# Patient Record
Sex: Female | Born: 2000 | Race: White | Hispanic: No | Marital: Single | Smoking: Never smoker
Health system: Southern US, Community
[De-identification: ages and names within clinical notes are randomized; demographics above are authoritative.]

## PROBLEM LIST (undated history)

## (undated) DIAGNOSIS — D580 Hereditary spherocytosis: Secondary | ICD-10-CM

## (undated) DIAGNOSIS — Z832 Family history of diseases of the blood and blood-forming organs and certain disorders involving the immune mechanism: Secondary | ICD-10-CM

## (undated) HISTORY — DX: Hereditary spherocytosis: D58.0

## (undated) HISTORY — DX: Family history of diseases of the blood and blood-forming organs and certain disorders involving the immune mechanism: Z83.2

---
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
## 2019-06-24 ENCOUNTER — Encounter: Payer: Self-pay | Admitting: Gastroenterology

## 2019-06-24 ENCOUNTER — Other Ambulatory Visit: Payer: Self-pay

## 2019-06-24 ENCOUNTER — Ambulatory Visit (INDEPENDENT_AMBULATORY_CARE_PROVIDER_SITE_OTHER): Payer: BC Managed Care – PPO | Admitting: Gastroenterology

## 2019-06-24 VITALS — BP 138/91 | HR 108 | Temp 98.3°F | Ht 62.5 in | Wt 107.2 lb

## 2019-06-24 DIAGNOSIS — R140 Abdominal distension (gaseous): Secondary | ICD-10-CM

## 2019-06-24 NOTE — Progress Notes (Signed)
Summer Rogers [address]  [address]  [place], [place] [address]  Main: [phone]  Fax: [phone]   Gastroenterology Consultation  Referring Provider:     Herb Grays, MD Primary Care Physician:  Herb Grays, MD Reason for Consultation:     Abdominal bloating        HPI:    Chief Complaint  Patient presents with  . New Patient (Initial Visit)  . Abdominal distention    Patient stated that for the past 3 weeks she started to have abdominal distention. Patient denied nausea, vomiting, diarrhea or constipation.    Summer Rogers is a 19 y.o. y/o female referred for consultation & management  by Dr. Herb Grays, MD.  Patient reports 70-month history of abdominal distention and bloating, specifically after eating, denies any abdominal pain.  States the distention and bloating is more with larger meals.  Specifically states that is not what she eats, or any certain diet triggers, but how much she eats that causes the symptoms.  Denies any nausea or vomiting, altered bowel habits, blood in stool.  Reports formed bowel movements every day.  No diarrhea or constipation.  No family history of GI malignancy.  No prior EGD or colonoscopy.  Denies any life stressors or anxiety.  Denies any new medications.  Started taking probiotics.  States had work-up for celiac disease by PCP this was negative.  Also states she avoided lactose for 1week based on PCP instruction, and this did not help.  Past Medical History:  Diagnosis Date  . Hereditary spherocytosis (HCC)     History reviewed. No pertinent surgical history.  Prior to Admission medications   Not on File    History reviewed. No pertinent family history.   Social History   Tobacco Use  . Smoking status: Never Smoker  . Smokeless tobacco: Never Used  Substance Use Topics  . Alcohol use: Never  . Drug use: Never    Allergies as of 06/24/2019  . (No Known Allergies)    Review of Systems:    All systems  reviewed and negative except where noted in HPI.   Physical Exam:  BP (!) 138/91   Pulse (!) 108   Temp 98.3 F (36.8 C) (Oral)   Ht 5' 2.5" (1.588 m)   Wt 107 lb 3.2 oz (48.6 kg)   BMI 19.29 kg/m  No LMP recorded. Psych:  Alert and cooperative. Normal mood and affect. General:   Alert,  Well-developed, well-nourished, pleasant and cooperative in NAD Head:  Normocephalic and atraumatic. Eyes:  Sclera clear, no icterus.   Conjunctiva pink. Ears:  Normal auditory acuity. Nose:  No deformity, discharge, or lesions. Mouth:  No deformity or lesions,oropharynx pink & moist. Neck:  Supple; no masses or thyromegaly. Abdomen:  Normal bowel sounds.  No bruits.  Soft, non-tender and non-distended without masses, hepatosplenomegaly or hernias noted.  No guarding or rebound tenderness.    Msk:  Symmetrical without gross deformities. Good, equal movement & strength bilaterally. Pulses:  Normal pulses noted. Extremities:  No clubbing or edema.  No cyanosis. Neurologic:  Alert and oriented x3;  grossly normal neurologically. Skin:  Intact without significant lesions or rashes. No jaundice. Lymph Nodes:  No significant cervical adenopathy. Psych:  Alert and cooperative. Normal mood and affect.   Labs: CBC No results found for: WBC, RBC, HGB, HCT, PLT, MCV, MCH, MCHC, RDW, LYMPHSABS, MONOABS, EOSABS, BASOSABS CMP  No results found for: NA, K, CL, CO2, GLUCOSE, BUN, CREATININE, CALCIUM,  PROT, ALBUMIN, AST, ALT, ALKPHOS, BILITOT, GFRNONAA, GFRAA  Imaging Studies: No results found.  Assessment and Plan:   Summer Rogers is a 19 y.o. y/o female has been referred for abdominal bloating and distention, especially with larger meals, with no abdominal pain or obstructive symptoms  Given the absence of any alarm symptoms, we have reassured her about her symptoms not being due to any concerning or worrisome etiology  Since she specifically states symptoms occur with larger meals, and after eating,  Some abdominal distention is expected after a larger meal.  She is not having any obstructive symptoms, nausea vomiting, diarrhea, or malabsorption or obstructive symptoms at all  We will obtain H. pylori breath test  Also discussed avoiding high FODMAP foods and handout given for the same.  Low FODMAP diet discussed as well  Obtain recent records from PCP, as these are not available to [place] at this time  If symptoms do not improve, can consider further testing.  However, due to the absence of any alarm symptoms at this time, no further imaging or work-up needed currently  Dr Vonda Antigua  Speech recognition software was used to dictate the above note.

## 2019-06-26 LAB — H. PYLORI BREATH TEST: H pylori Breath Test: NEGATIVE

## 2019-06-27 ENCOUNTER — Telehealth: Payer: Self-pay

## 2019-06-27 NOTE — Telephone Encounter (Signed)
-----   Message from Pasty Spillers, MD sent at 06/27/2019 11:14 AM EDT ----- Kandis Cocking please let the patient know, her h pylori test was negative

## 2019-06-27 NOTE — Telephone Encounter (Signed)
Called patient to let her know that her H Pylori was negative but that if she continued to have any issues, to please call us back. Patient agreed and had no questions.

## 2019-07-10 DIAGNOSIS — R140 Abdominal distension (gaseous): Secondary | ICD-10-CM | POA: Insufficient documentation

## 2019-07-15 ENCOUNTER — Telehealth: Payer: Self-pay

## 2019-07-15 NOTE — Telephone Encounter (Signed)
Medical records were requested and I faxed a release of information.

## 2019-08-11 ENCOUNTER — Telehealth: Payer: Self-pay

## 2019-08-11 NOTE — Telephone Encounter (Signed)
Patient started the low fod diet about 1 month after her appointment on 06/24/2019. Patient states she  Is still bloated but having no other symptoms. Patient wants to know what else she can do

## 2019-08-15 NOTE — Telephone Encounter (Signed)
error 

## 2019-08-15 NOTE — Telephone Encounter (Signed)
Dr. Michele Mcalpine response by secure chat: we can proceed with EGD if she would like. If she is not having daily soft BMs she can try miralax as well. if BMs are good, and symptoms continue we can do EGD.  I called patient and left her a detailed message giving her Dr. Michele Mcalpine recommendations. I asked her to call us back if she had further questions.

## 2019-09-30 ENCOUNTER — Encounter: Payer: Self-pay | Admitting: Gastroenterology

## 2019-09-30 ENCOUNTER — Other Ambulatory Visit: Payer: Self-pay

## 2019-09-30 ENCOUNTER — Ambulatory Visit (INDEPENDENT_AMBULATORY_CARE_PROVIDER_SITE_OTHER): Payer: BC Managed Care – PPO | Admitting: Gastroenterology

## 2019-09-30 VITALS — BP 141/84 | HR 137 | Temp 99.2°F | Ht 62.5 in | Wt 107.6 lb

## 2019-09-30 DIAGNOSIS — R1900 Intra-abdominal and pelvic swelling, mass and lump, unspecified site: Secondary | ICD-10-CM | POA: Diagnosis not present

## 2019-09-30 DIAGNOSIS — R140 Abdominal distension (gaseous): Secondary | ICD-10-CM | POA: Diagnosis not present

## 2019-09-30 DIAGNOSIS — R17 Unspecified jaundice: Secondary | ICD-10-CM

## 2019-09-30 NOTE — Patient Instructions (Signed)
Please go downstairs and get your contrast today.  Please do not eat or drink 4 hours before your CT Scan.

## 2019-10-01 ENCOUNTER — Other Ambulatory Visit: Payer: Self-pay

## 2019-10-01 DIAGNOSIS — R1900 Intra-abdominal and pelvic swelling, mass and lump, unspecified site: Secondary | ICD-10-CM

## 2019-10-01 DIAGNOSIS — R140 Abdominal distension (gaseous): Secondary | ICD-10-CM

## 2019-10-01 LAB — BILIRUBIN, FRACTIONATED(TOT/DIR/INDIR)
Bilirubin Total: 2.3 mg/dL — ABNORMAL HIGH (ref 0.0–1.2)
Bilirubin, Direct: 0.25 mg/dL (ref 0.00–0.40)
Bilirubin, Indirect: 2.05 mg/dL (ref 0.10–0.80)

## 2019-10-01 NOTE — Addendum Note (Signed)
Addended by: Adela Ports on: 10/01/2019 05:12 PM   Modules accepted: Orders

## 2019-10-01 NOTE — Progress Notes (Signed)
    Melodie Bouillon, MD [address]  [address]  [place], [place] [address]  Main: [phone]  Fax: [phone]   Primary Care Physician: Herb Grays, MD   Chief Complaint  Patient presents with  . Bloated    HPI: MADA SADIK is a 19 y.o. female here for follow-up.  Her main complaint is "distention, and not bloating".  States lower part of the abdomen is always distended.  She is not feeling any bloating, pain, nausea or vomiting.  Good appetite.  No weight loss.  Reports 1 formed bowel movement daily.  No dysphagia  No current outpatient medications on file.   No current facility-administered medications for this visit.    Allergies as of 09/30/2019  . (No Known Allergies)    ROS:  General: Negative for anorexia, weight loss, fever, chills, fatigue, weakness. ENT: Negative for hoarseness, difficulty swallowing , nasal congestion. CV: Negative for chest pain, angina, palpitations, dyspnea on exertion, peripheral edema.  Respiratory: Negative for dyspnea at rest, dyspnea on exertion, cough, sputum, wheezing.  GI: See history of present illness. GU:  Negative for dysuria, hematuria, urinary incontinence, urinary frequency, nocturnal urination.  Endo: Negative for unusual weight change.    Physical Examination:   BP (!) 141/84   Pulse (!) 137   Temp 99.2 F (37.3 C) (Tympanic)   Ht 5' 2.5" (1.588 m)   Wt 107 lb 9.6 oz (48.8 kg)   BMI 19.37 kg/m   General: Well-nourished, well-developed in no acute distress.  Eyes: No icterus. Conjunctivae pink. Mouth: Oropharyngeal mucosa moist and pink , no lesions erythema or exudate. Neck: Supple, Trachea midline Abdomen: Bowel sounds are normal, nontender, nondistended, no hepatosplenomegaly or masses, no abdominal bruits or hernia , no rebound or guarding.   Extremities: No lower extremity edema. No clubbing or deformities. Neuro: Alert and oriented x 3.  Grossly intact. Skin: Warm and dry, no jaundice.     Psych: Alert and cooperative, normal mood and affect.   Labs: CMP     Component Value Date/Time   BILITOT 2.3 (H) 09/30/2019 1632   No results found for: WBC, HGB, HCT, MCV, PLT  Imaging Studies: No results found.  Assessment and Plan:   CHARLAYNE VULTAGGIO is a 19 y.o. y/o female here for follow-up and reports that the lower part of her abdomen is always distended  When asked, patient points to bilateral lower abdominal region as the area of the distention  On my examination, there is no distention, but rather subcutaneous fat in this area that she is pointing to.  It is nontender.  I specifically asked her if she thinks she may have issues with body fat perception, and she denies this.  Patient is otherwise healthy.  I do not suspect any concerning etiology of her complaints.  However, she would like further evaluation and we will order CT scan  Her total bilirubin was recently elevated and I have ordered further fractionation which reveals elevation in indirect bilirubin and not direct.  Therefore not from liver etiology.  We discussed [place] syndrome and described this to her as well.     Dr Melodie Bouillon

## 2019-10-03 ENCOUNTER — Ambulatory Visit: Admission: RE | Admit: 2019-10-03 | Payer: BC Managed Care – PPO | Source: Ambulatory Visit

## 2019-10-09 DIAGNOSIS — D580 Hereditary spherocytosis: Secondary | ICD-10-CM | POA: Insufficient documentation

## 2019-10-10 ENCOUNTER — Ambulatory Visit
Admission: RE | Admit: 2019-10-10 | Discharge: 2019-10-10 | Disposition: A | Discharge status: Home / Self Care | Payer: BC Managed Care – PPO | Source: Ambulatory Visit | Attending: Gastroenterology | Admitting: Gastroenterology

## 2019-10-10 ENCOUNTER — Other Ambulatory Visit: Payer: Self-pay

## 2019-10-10 DIAGNOSIS — R1900 Intra-abdominal and pelvic swelling, mass and lump, unspecified site: Secondary | ICD-10-CM | POA: Insufficient documentation

## 2019-10-10 DIAGNOSIS — R140 Abdominal distension (gaseous): Secondary | ICD-10-CM | POA: Diagnosis not present

## 2019-10-10 MED ORDER — IOHEXOL 300 MG/ML  SOLN
75.0000 mL | Freq: Once | INTRAMUSCULAR | Status: AC | PRN
  Administered 2019-10-10: 75 mL via INTRAVENOUS

## 2019-10-13 ENCOUNTER — Telehealth: Payer: Self-pay

## 2019-10-13 NOTE — Telephone Encounter (Signed)
Called patient but had to leave her a detailed message letting her know about her CT Scan as below.

## 2019-10-13 NOTE — Telephone Encounter (Signed)
-----   Message from Pasty Spillers, MD sent at 10/13/2019 11:25 AM EDT ----- Summer Rogers please let the patient know, her CT was normal and did not show any concerning changes.

## 2021-01-14 ENCOUNTER — Encounter: Payer: Self-pay | Admitting: Advanced Practice Midwife

## 2021-01-14 ENCOUNTER — Other Ambulatory Visit: Payer: Self-pay

## 2021-01-14 ENCOUNTER — Ambulatory Visit (INDEPENDENT_AMBULATORY_CARE_PROVIDER_SITE_OTHER): Payer: BC Managed Care – PPO | Admitting: Advanced Practice Midwife

## 2021-01-14 VITALS — BP 112/60 | HR 96 | Ht 62.0 in | Wt 102.0 lb

## 2021-01-14 DIAGNOSIS — Z23 Encounter for immunization: Secondary | ICD-10-CM | POA: Diagnosis not present

## 2021-01-14 DIAGNOSIS — Z832 Family history of diseases of the blood and blood-forming organs and certain disorders involving the immune mechanism: Secondary | ICD-10-CM

## 2021-01-14 DIAGNOSIS — Z0000 Encounter for general adult medical examination without abnormal findings: Secondary | ICD-10-CM | POA: Diagnosis not present

## 2021-01-14 NOTE — Progress Notes (Signed)
Gynecology Annual Exam  PCP: Tresea Mall, MD  Chief Complaint:  Chief Complaint  Patient presents with   Gynecologic Exam    Annual - RM 4    History of Present Illness: Patient is a 20 y.o. G0P0000 presents for annual exam. The patient has no gyn complaints today. She is interested in starting birth control prior to going to college, however, her cousin has a prothrombin genetic variation that caused her to have a blood clot from taking birth control. It has been determined that the gene variation is on the patient's father's side of the family. Dr Gilman Schmidt suggested a referral to hematology. The patient is not sexually active and has no concerns for STDs. She defers PAP smear to her next annual visit.   LMP: Patient's last menstrual period was 01/06/2021. Menarche:12 Average Interval: regular, 28 days Duration of flow:  5-6  days Heavy Menses: no Clots: no Intermenstrual Bleeding: no Postcoital Bleeding: not applicable Dysmenorrhea: no  The patient is not sexually active. She currently uses none for contraception. She denies dyspareunia.  The patient does perform self breast exams.  There is notable family history of breast or ovarian cancer in her family. Her father's father and grandfather both had breast cancer. The patient declines genetic testing at this time.   The patient wears seatbelts: yes.  The patient has regular exercise:  she walks sometimes, she admits healthy diet, hydration and sleep .    The patient denies current symptoms of depression.    Review of Systems: Review of Systems  Constitutional:  Negative for chills and fever.  HENT:  Negative for congestion, ear discharge, ear pain, hearing loss, sinus pain and sore throat.   Eyes:  Negative for blurred vision and double vision.  Respiratory:  Negative for cough, shortness of breath and wheezing.   Cardiovascular:  Negative for chest pain, palpitations and leg swelling.  Gastrointestinal:  Negative for  abdominal pain, blood in stool, constipation, diarrhea, heartburn, melena, nausea and vomiting.  Genitourinary:  Negative for dysuria, flank pain, frequency, hematuria and urgency.  Musculoskeletal:  Negative for back pain, joint pain and myalgias.  Skin:  Negative for itching and rash.  Neurological:  Negative for dizziness, tingling, tremors, sensory change, speech change, focal weakness, seizures, loss of consciousness, weakness and headaches.  Endo/Heme/Allergies:  Negative for environmental allergies. Does not bruise/bleed easily.  Psychiatric/Behavioral:  Negative for depression, hallucinations, memory loss, substance abuse and suicidal ideas. The patient is not nervous/anxious and does not have insomnia.    Past Medical History:  Patient Active Problem List   Diagnosis Date Noted   Hereditary spherocytosis ([place]) 10/09/2019   Bloated abdomen 07/10/2019    Past Surgical History:  History reviewed. No pertinent surgical history.  Gynecologic History:  Patient's last menstrual period was 01/06/2021. Contraception: none Last Pap: she has never had a PAP smear  Obstetric History: G0P0000  Family History:  Family History  Problem Relation Age of Onset   Colon cancer Maternal Grandmother    Lung cancer Paternal Grandmother     Social History:  Social History   Socioeconomic History   Marital status: Single    Spouse name: Not on file   Number of children: Not on file   Years of education: Not on file   Highest education level: Not on file  Occupational History   Not on file  Tobacco Use   Smoking status: Never   Smokeless tobacco: Never  Vaping Use   Vaping Use:  Never used  Substance and Sexual Activity   Alcohol use: Never   Drug use: Never   Sexual activity: Never    Birth control/protection: Abstinence  Other Topics Concern   Not on file  Social History Narrative   Not on file   Social Determinants of Health   Financial Resource Strain: Not on file  Food  Insecurity: Not on file  Transportation Needs: Not on file  Physical Activity: Not on file  Stress: Not on file  Social Connections: Not on file  Intimate Partner Violence: Not on file    Allergies:  No Known Allergies  Medications: Prior to Admission medications   Not on File    Physical Exam Vitals: Blood pressure 112/60, pulse 96, height 5\' 2"  (1.575 m), weight 102 lb (46.3 kg), last menstrual period 01/06/2021.  General: NAD HEENT: normocephalic, anicteric Thyroid: no enlargement, no palpable nodules Pulmonary: No increased work of breathing, CTAB Cardiovascular: RRR, distal pulses 2+ Breast: Breast symmetrical, no tenderness, no palpable nodules or masses, no skin or nipple retraction present, no nipple discharge.  No axillary or supraclavicular lymphadenopathy. Abdomen: NABS, soft, non-tender, non-distended.  Umbilicus without lesions.  No hepatomegaly, splenomegaly or masses palpable. No evidence of hernia  Genitourinary: deferred for no concerns/declines PAP Extremities: no edema, erythema, or tenderness Neurologic: Grossly intact Psychiatric: mood appropriate, affect full   Assessment: 20 y.o. G0P0000 routine annual exam  Plan: Problem List Items Addressed This Visit   None Visit Diagnoses     Well woman exam without gynecological exam    -  Primary   Need for immunization against influenza       Relevant Orders   Flu Vaccine QUAD 72mo+IM (Fluarix, Fluzone & Alfiuria Quad PF) (Completed)   Family history of prothrombin gene mutation       Relevant Orders   Ambulatory referral to Hematology / Oncology       1) 4) Gardasil Series discussed and if applicable offered to patient - Patient has not previously completed 3 shot series   2) STI screening  was offered and declined  3)  ASCCP guidelines and rationale discussed.  Patient opts for  beginning  screening at next annual visit  4) Contraception - the patient is currently using  none.  She is   considering using Paragard IUD and would like to decide after she has genetic testing for possible prothrombin gene variation.  We discussed safe sex practices to reduce her furture risk of STI's.    5) Return in about 1 year (around 01/14/2022) for annual established gyn.   01/16/2022, CNM [facility] 01/14/2021, 3:59 PM

## 2021-01-24 ENCOUNTER — Encounter: Payer: Self-pay | Admitting: *Deleted

## 2021-02-01 ENCOUNTER — Other Ambulatory Visit: Payer: Self-pay

## 2021-02-01 ENCOUNTER — Inpatient Hospital Stay: Payer: BC Managed Care – PPO | Attending: Oncology | Admitting: Oncology

## 2021-02-01 ENCOUNTER — Inpatient Hospital Stay: Payer: BC Managed Care – PPO

## 2021-02-01 ENCOUNTER — Encounter: Payer: Self-pay | Admitting: Oncology

## 2021-02-01 VITALS — BP 125/79 | HR 105 | Temp 98.0°F | Wt 104.1 lb

## 2021-02-01 DIAGNOSIS — Z803 Family history of malignant neoplasm of breast: Secondary | ICD-10-CM | POA: Insufficient documentation

## 2021-02-01 DIAGNOSIS — Z801 Family history of malignant neoplasm of trachea, bronchus and lung: Secondary | ICD-10-CM | POA: Diagnosis not present

## 2021-02-01 DIAGNOSIS — Z832 Family history of diseases of the blood and blood-forming organs and certain disorders involving the immune mechanism: Secondary | ICD-10-CM

## 2021-02-01 DIAGNOSIS — D6852 Prothrombin gene mutation: Secondary | ICD-10-CM | POA: Insufficient documentation

## 2021-02-01 DIAGNOSIS — Z800 Family history of malignant neoplasm of digestive organs: Secondary | ICD-10-CM | POA: Diagnosis not present

## 2021-02-01 DIAGNOSIS — D580 Hereditary spherocytosis: Secondary | ICD-10-CM | POA: Diagnosis not present

## 2021-02-03 ENCOUNTER — Encounter: Payer: Self-pay | Admitting: Oncology

## 2021-02-03 NOTE — Progress Notes (Signed)
Hematology/Oncology Consult note [facility] [phone] [phone]  Patient Care Team: Tresea Mall, MD as PCP - General (Pediatrics) Sindy Guadeloupe, MD as Consulting Physician (Hematology and Oncology) Rod Can, CNM as Referring Physician (Obstetrics)   Name of the patient: Summer Rogers  MQ:[id number]  [birth year]    Reason for referral-family history of prothrombin gene mutation   Referring physician-Jane Dennard Nip, CNM  Date of visit: 02/03/21   History of presenting illness-patient is a 21 year old female who was recently seen by GYN for consideration of start of contraception.  Patient's first cousin who is her father's brothers daughter was diagnosed with extensive lower extremity DVT in the setting of contraception use recently.  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  I do not have those records to review.  Therefore patient is interested in getting tested.  No personal history of DVT or PE.  No other family history of thrombosis.  Patient is otherwise doing well and denies any other complaints at this time.  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   ECOG PS- 0  Pain scale- 0   Review of systems- Review of Systems  Constitutional:  Negative for chills, fever, malaise/fatigue and weight loss.  HENT:  Negative for congestion, ear discharge and nosebleeds.   Eyes:  Negative for blurred vision.  Respiratory:  Negative for cough, hemoptysis, sputum production, shortness of breath and wheezing.   Cardiovascular:  Negative for chest pain, palpitations, orthopnea and claudication.  Gastrointestinal:  Negative for abdominal pain, blood in stool, constipation, diarrhea, heartburn, melena, nausea and vomiting.  Genitourinary:  Negative for dysuria, flank pain, frequency, hematuria and urgency.  Musculoskeletal:  Negative for back pain, joint pain and myalgias.  Skin:  Negative for rash.  Neurological:   Negative for dizziness, tingling, focal weakness, seizures, weakness and headaches.  Endo/Heme/Allergies:  Does not bruise/bleed easily.  Psychiatric/Behavioral:  Negative for depression and suicidal ideas. The patient does not have insomnia.    No Known Allergies  Patient Active Problem List   Diagnosis Date Noted   Hereditary spherocytosis ([place]) 10/09/2019   Bloated abdomen 07/10/2019     Past Medical History:  Diagnosis Date   Family history of prothrombin gene mutation    Hereditary spherocytosis ([place])      History reviewed. No pertinent surgical history.  Social History   Socioeconomic History   Marital status: Single    Spouse name: Not on file   Number of children: Not on file   Years of education: Not on file   Highest education level: Not on file  Occupational History   Not on file  Tobacco Use   Smoking status: Never   Smokeless tobacco: Never  Vaping Use   Vaping Use: Never used  Substance and Sexual Activity   Alcohol use: Never   Drug use: Never   Sexual activity: Never    Birth control/protection: Abstinence  Other Topics Concern   Not on file  Social History Narrative   Not on file   Social Determinants of Health   Financial Resource Strain: Not on file  Food Insecurity: Not on file  Transportation Needs: Not on file  Physical Activity: Not on file  Stress: Not on file  Social Connections: Not on file  Intimate Partner Violence: Not on file     Family History  Problem Relation Age of Onset   Hypercholesterolemia Father    Heart attack Father    Hereditary spherocytosis Brother  Colon cancer Maternal Grandmother    Lung cancer Paternal Grandmother    Breast cancer Paternal Grandfather    Other Cousin        prothrombin genetic variation    No current outpatient medications on file.   Physical exam:  Vitals:   02/01/21 1516  BP: 125/79  Pulse: (!) 105  Temp: 98 F (36.7 C)  TempSrc: Tympanic  Weight: 104 lb 1.6 oz (47.2  kg)   Physical Exam Constitutional:      General: She is not in acute distress. Cardiovascular:     Rate and Rhythm: Normal rate and regular rhythm.     Heart sounds: Normal heart sounds.  Pulmonary:     Effort: Pulmonary effort is normal.     Breath sounds: Normal breath sounds.  Musculoskeletal:     Right lower leg: No edema.     Left lower leg: No edema.  Skin:    General: Skin is warm and dry.  Neurological:     Mental Status: She is alert and oriented to person, place, and time.       CMP Latest Ref Rng & Units 09/30/2019  Total Bilirubin 0.0 - 1.2 mg/dL 2.3(H)   Assessment and plan- Patient is a 21 y.o. female referred for family history of prothrombin gene mutation  I do not have the actual reports of the family member who tested positive.  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  It is possible that patient may not have this gene mutation at all since we do not know if patient's father or mother have this mutation.  No personal history of thrombosis.  Discussed with the patient that if she has prothrombin gene mutation and it would depend if she is homozygous or heterozygous.  Homozygosity for prothrombin gene mutation carries higher risk for DVT/PE as compared to heterozygosity.  No prophylactic anticoagulation is recommended even if she tests positive for gene mutation in the absence of personal history of DVT or PE.  If she is positive for prothrombin gene mutation and in general estrogen containing contraception methods should be avoided given the risks of increased DVT and PE.  Oral progestin only methods of contraception would still be considered acceptable.  I will discuss the actual risks in greater detail after her genetic test for prothrombin gene mutation is back.  I will not be testing her for other hypercoagulable states in the absence of known family history and as such screening is not recommended in the general population prior to start of  contraception   Thank you for this kind referral and the opportunity to participate in the care of this patient   Visit Diagnosis 1. FH: prothrombin gene mutation     Dr. Randa Evens, MD, MPH [facility] [id number] 02/03/2021

## 2021-02-04 ENCOUNTER — Encounter: Payer: Self-pay | Admitting: Oncology

## 2021-02-07 LAB — PROTHROMBIN GENE MUTATION

## 2021-02-15 ENCOUNTER — Other Ambulatory Visit: Payer: Self-pay

## 2021-02-15 ENCOUNTER — Inpatient Hospital Stay (HOSPITAL_BASED_OUTPATIENT_CLINIC_OR_DEPARTMENT_OTHER): Payer: BC Managed Care – PPO | Admitting: Oncology

## 2021-02-15 DIAGNOSIS — Z800 Family history of malignant neoplasm of digestive organs: Secondary | ICD-10-CM | POA: Diagnosis not present

## 2021-02-15 DIAGNOSIS — Z801 Family history of malignant neoplasm of trachea, bronchus and lung: Secondary | ICD-10-CM | POA: Diagnosis not present

## 2021-02-15 DIAGNOSIS — D6852 Prothrombin gene mutation: Secondary | ICD-10-CM

## 2021-02-15 DIAGNOSIS — Z803 Family history of malignant neoplasm of breast: Secondary | ICD-10-CM

## 2021-03-24 ENCOUNTER — Encounter: Payer: Self-pay | Admitting: Oncology

## 2021-03-24 NOTE — Progress Notes (Signed)
I connected with Summer Rogers on 03/24/21 at  2:00 PM EST by video enabled telemedicine visit and verified that I am speaking with the correct person using two identifiers.   I discussed the limitations, risks, security and privacy concerns of performing an evaluation and management service by telemedicine and the availability of in-person appointments. I also discussed with the patient that there may be a patient responsible charge related to this service. The patient expressed understanding and agreed to proceed.  Other persons participating in the visit and their role in the encounter:  none  Patient's location:  home Provider's location:  work  Risk analyst Complaint:  discuss bloodwork results  History of present illness: patient is a 21 year old female who was recently seen by GYN for consideration of start of contraception.  Patient's first cousin who is her father's brothers daughter was diagnosed with extensive lower extremity DVT in the setting of contraception use recently.  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  I do not have those records to review.  Therefore patient is interested in getting tested.  No personal history of DVT or PE.  No other family history of thrombosis.  Patient is otherwise doing well and denies any other complaints at this time.  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   Results of blood work from 02/01/2021 showed heterozygosity for prothrombin gene mutation  Interval history patient is doing well and denies any complaints at this time   Review of Systems  Constitutional:  Negative for chills, fever, malaise/fatigue and weight loss.  HENT:  Negative for congestion, ear discharge and nosebleeds.   Eyes:  Negative for blurred vision.  Respiratory:  Negative for cough, hemoptysis, sputum production, shortness of breath and wheezing.   Cardiovascular:  Negative for chest pain, palpitations, orthopnea and claudication.   Gastrointestinal:  Negative for abdominal pain, blood in stool, constipation, diarrhea, heartburn, melena, nausea and vomiting.  Genitourinary:  Negative for dysuria, flank pain, frequency, hematuria and urgency.  Musculoskeletal:  Negative for back pain, joint pain and myalgias.  Skin:  Negative for rash.  Neurological:  Negative for dizziness, tingling, focal weakness, seizures, weakness and headaches.  Endo/Heme/Allergies:  Does not bruise/bleed easily.  Psychiatric/Behavioral:  Negative for depression and suicidal ideas. The patient does not have insomnia.    No Known Allergies  Past Medical History:  Diagnosis Date   Family history of prothrombin gene mutation    Hereditary spherocytosis ([place])     No past surgical history on file.  Social History   Socioeconomic History   Marital status: Single    Spouse name: Not on file   Number of children: Not on file   Years of education: Not on file   Highest education level: Not on file  Occupational History   Not on file  Tobacco Use   Smoking status: Never   Smokeless tobacco: Never  Vaping Use   Vaping Use: Never used  Substance and Sexual Activity   Alcohol use: Never   Drug use: Never   Sexual activity: Never    Birth control/protection: Abstinence  Other Topics Concern   Not on file  Social History Narrative   Not on file   Social Determinants of Health   Financial Resource Strain: Not on file  Food Insecurity: Not on file  Transportation Needs: Not on file  Physical Activity: Not on file  Stress: Not on file  Social Connections: Not on file  Intimate Partner Violence: Not on file  Family History  Problem Relation Age of Onset   Hypercholesterolemia Father    Heart attack Father    Hereditary spherocytosis Brother    Colon cancer Maternal Grandmother    Lung cancer Paternal Grandmother    Breast cancer Paternal Grandfather    Other Cousin        prothrombin genetic variation    No current  outpatient medications on file.  No results found.  No images are attached to the encounter.   CMP Latest Ref Rng & Units 09/30/2019  Total Bilirubin 0.0 - 1.2 mg/dL 2.3(H)   No flowsheet data found.   Observation/objective: Appears in no acute distress on video visit today.  Breathing is nonlabored  Assessment and plan: Patient is a 21 year old female with heterozygosity for prothrombin gene mutation and this is a visit to discuss further management  Given the patient had a first-degree relative with history of VTE and patient herself has heterozygosity for prothrombin gene mutation but has never had a personal history of VTE-in general I would recommend avoiding estrogen progestin contraception combination although it is not absolutely contraindicated.  I would also advise against any transdermal patch of contraceptive vaginal ring.  Injectable progestins will be avoided but oral progestin only methods as well as IUD would be acceptable methods of contraception and these do not appear to significantly increase the risk of thrombosis even in patients with inherited thrombophilias.  Her overall risk of VTE with single prothrombin gene mutation was about 0.35 for 100 women years.  Patient can discuss this further with her GYN as well.  She does not require to go on any prophylactic anticoagulation at this time but will need to bring this up in the future when she is considering pregnancy with either hematology or GYN  Follow-up instructions: No follow-up needed with me at this time  I discussed the assessment and treatment plan with the patient. The patient was provided an opportunity to ask questions and all were answered. The patient agreed with the plan and demonstrated an understanding of the instructions.   The patient was advised to call back or seek an in-person evaluation if the symptoms worsen or if the condition fails to improve as anticipated.  Visit Diagnosis: 1. Prothrombin gene  mutation [facility])     Dr. Randa Evens, MD, MPH [facility] Tel- [phone] 03/24/2021 11:38 AM

## 2021-11-05 IMAGING — CT CT ABD-PELV W/ CM
1 of 2 series · 15 of 32 positions shown, 19 images · IV contrast (APPLIED)
Comparison: None.

CLINICAL DATA: Abdominal distension and bloating for 4 months

EXAM:
CT ABDOMEN AND PELVIS WITH CONTRAST
TECHNIQUE: Multidetector CT imaging of the abdomen and pelvis was performed
using the standard protocol following bolus administration of
intravenous contrast.
CONTRAST:  75mL OMNIPAQUE IOHEXOL 300 MG/ML  SOLN

[Series 2: axial st · axial · 0.59mm/px · z∈[-894,-488]mm · 15 of 89 slices shown, 19 images]
[im 4/89  soft-tissue]
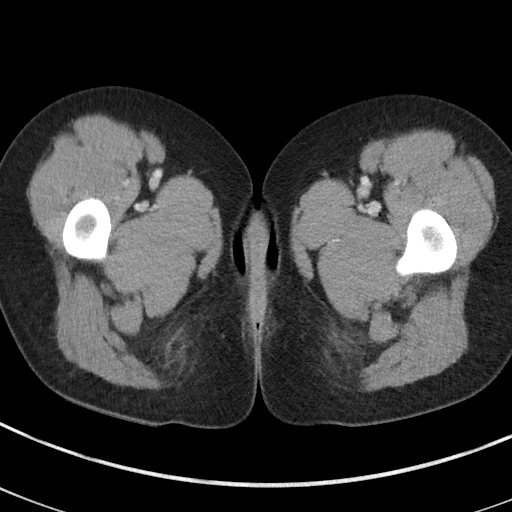
[im 4/89  bone]
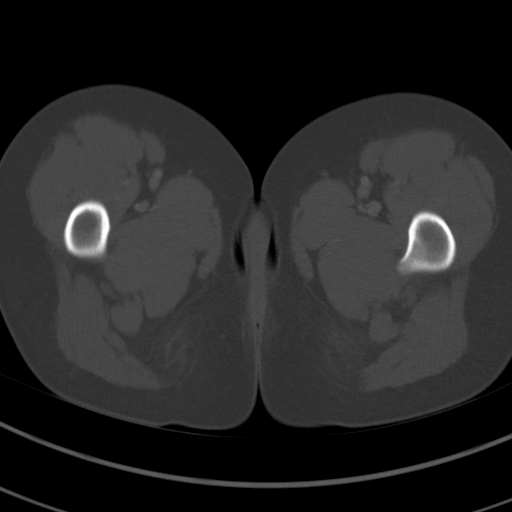
[im 11/89  soft-tissue]
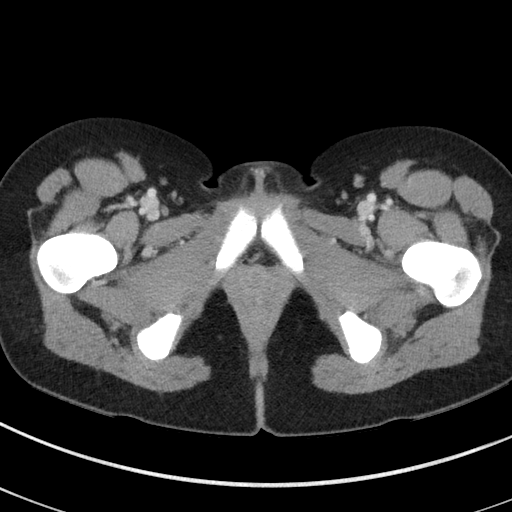
[im 18/89  soft-tissue]
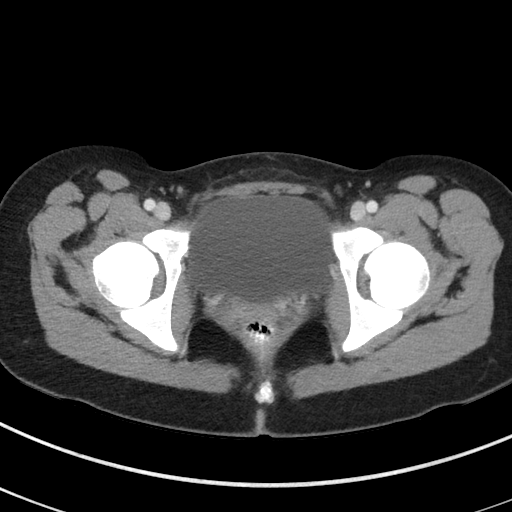
[im 25/89  soft-tissue]
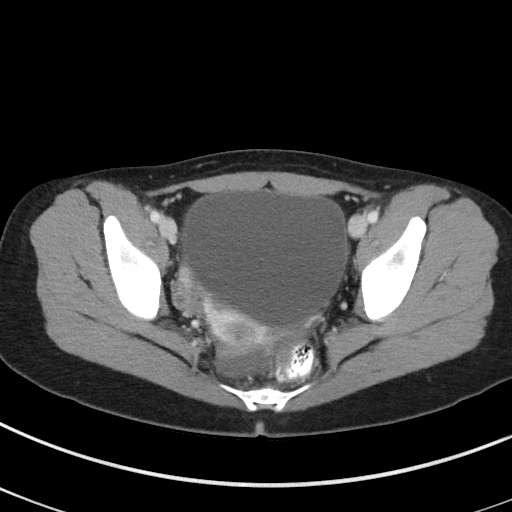
[im 32/89  soft-tissue]
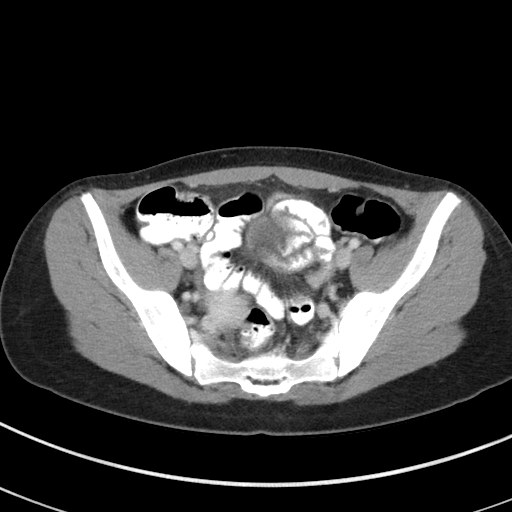
[im 39/89  soft-tissue]
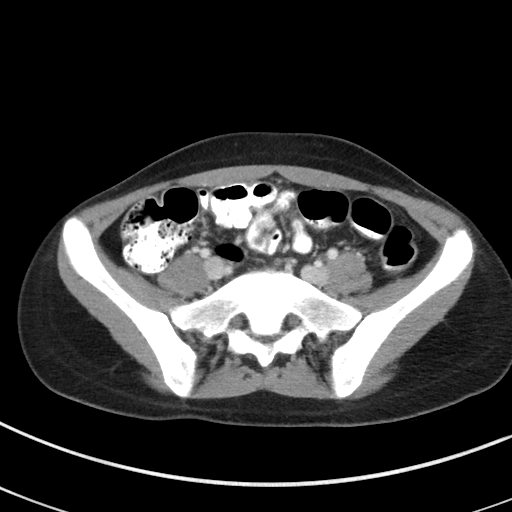
[im 46/89  soft-tissue]
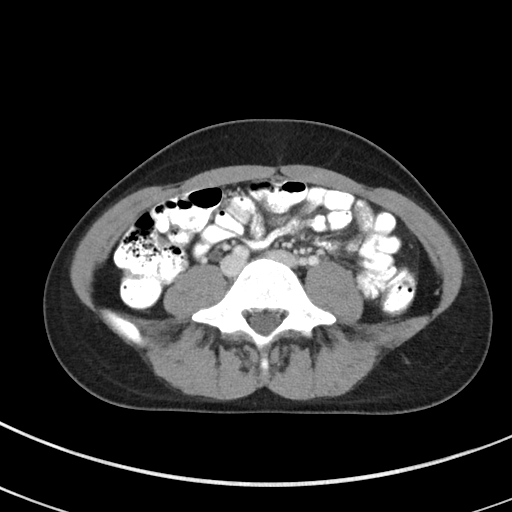
[im 50/89  soft-tissue]
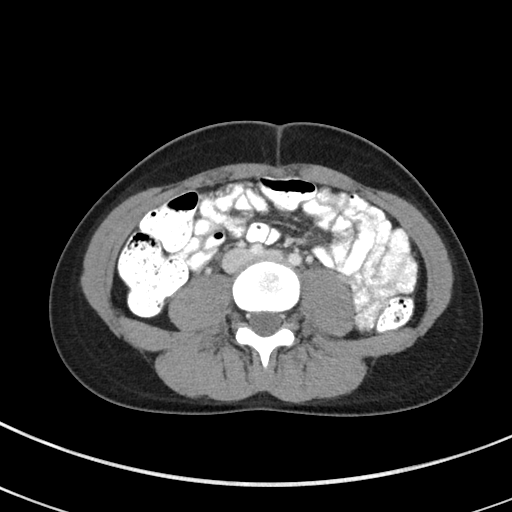
[im 57/89  soft-tissue]
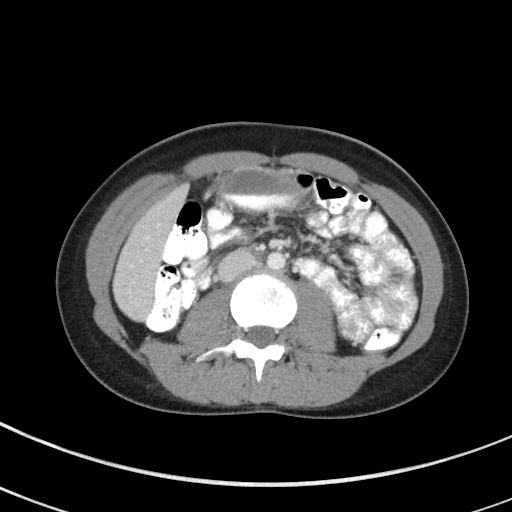
[im 57/89  bone]
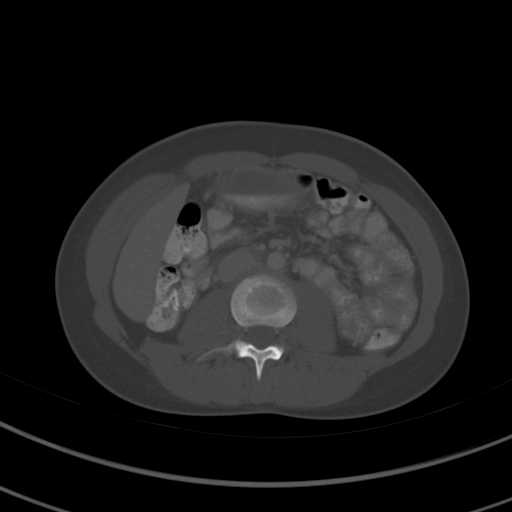
[im 64/89  soft-tissue]
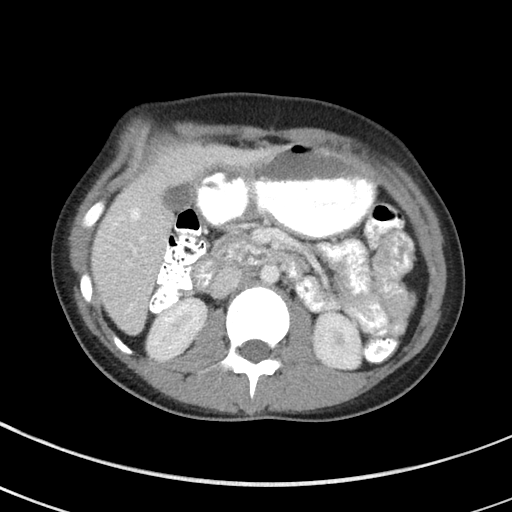
[im 71/89  soft-tissue]
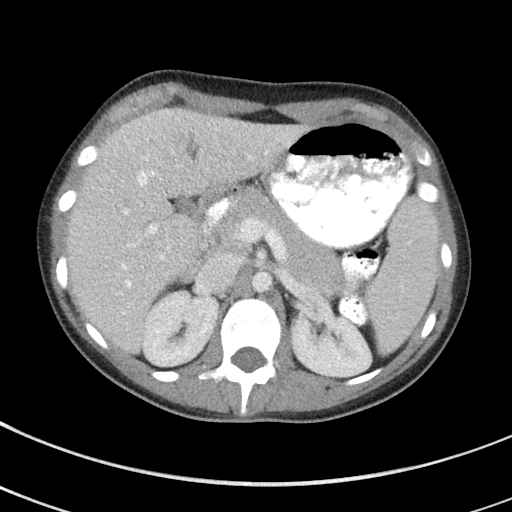
[im 74/89  lung]
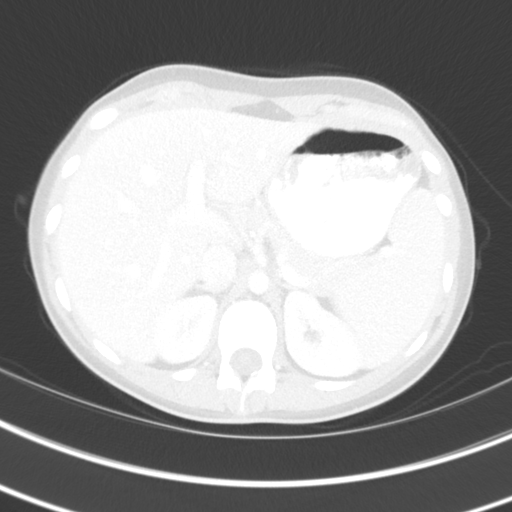
[im 78/89  soft-tissue]
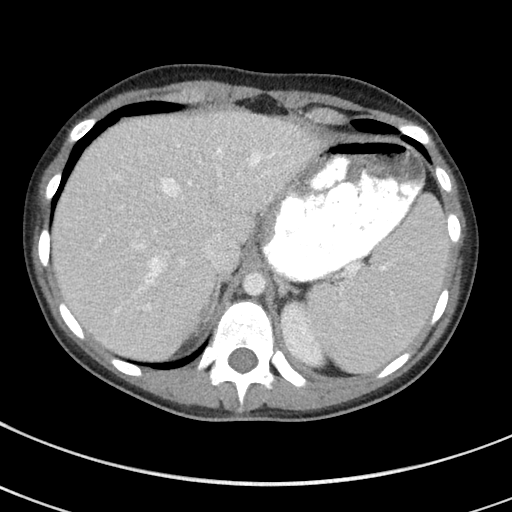
[im 78/89  lung]
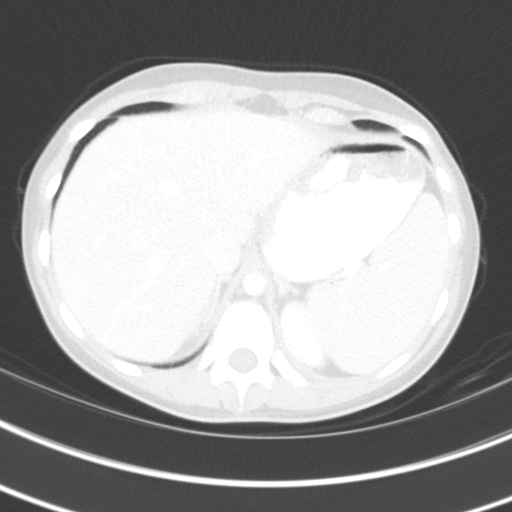
[im 81/89  lung]
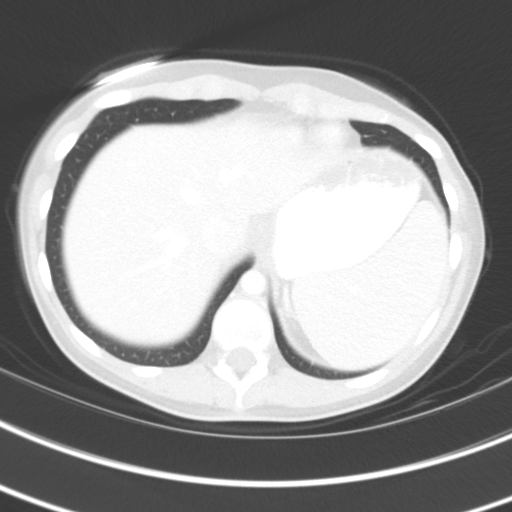
[im 85/89  soft-tissue]
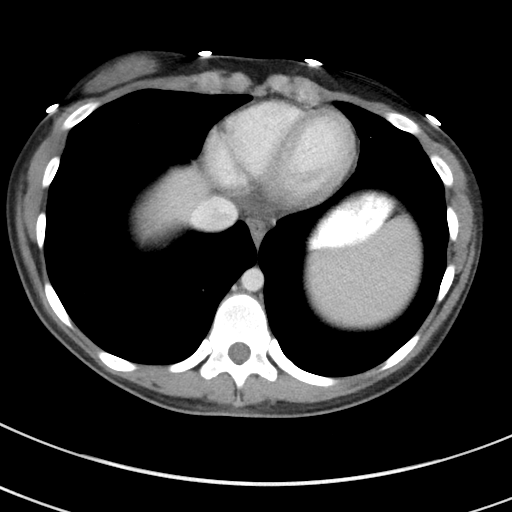
[im 85/89  lung]
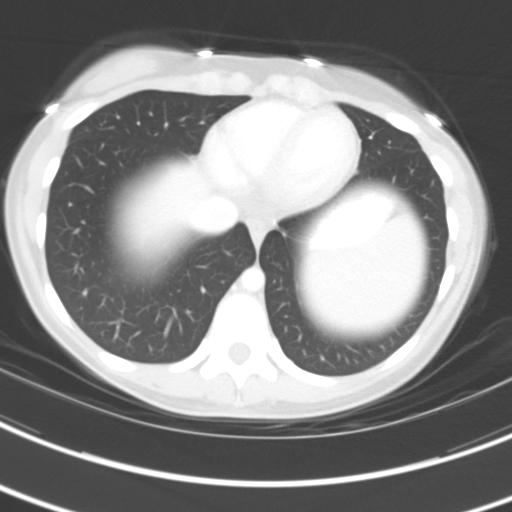

[15 of 32 positions shown; findings below may reference images not displayed]

FINDINGS: Lower chest: Lung bases are clear. Normal heart size. No pericardial
effusion.

Hepatobiliary: No worrisome focal liver lesions. Smooth liver
surface contour. Normal hepatic attenuation. Normal gallbladder.
Normal biliary tree.

Pancreas: Unremarkable. No pancreatic ductal dilatation or
surrounding inflammatory changes.

Spleen: Normal in size. No concerning splenic lesions.

Adrenals/Urinary Tract: Normal adrenal glands. Kidneys are normally
located with symmetric enhancement. No suspicious renal lesion,
urolithiasis or hydronephrosis. Normal physiologic distention of the
urinary bladder. No acute bladder abnormality.

Stomach/Bowel: High attenuation enteric contrast media traverses
from the stomach through the colon without evidence of obstruction.
No significant large or small bowel thickening or dilatation. Normal
air-filled appendix seen in the right lower quadrant.

Vascular/Lymphatic: No significant vascular findings are present. No
enlarged abdominal or pelvic lymph nodes.

Reproductive: Uterus and bilateral adnexa are unremarkable. Normal
follicles.

Other: No abdominopelvic free fluid or free gas. No bowel containing
hernias.

Musculoskeletal: No acute osseous abnormality or suspicious osseous
lesion.
IMPRESSION: No acute intra-abdominal process to provide cause for patient's
symptoms.
